# Patient Record
Sex: Female | Born: 1960 | Race: Black or African American | Hispanic: No
Health system: Southern US, Community
[De-identification: ages and names within clinical notes are randomized; demographics above are authoritative.]

---

## 2010-09-08 ENCOUNTER — Emergency Department (HOSPITAL_COMMUNITY)
Admission: EM | Admit: 2010-09-08 | Discharge: 2010-09-08 | Disposition: A | Discharge status: Home / Self Care | Payer: Self-pay | Attending: Emergency Medicine | Admitting: Emergency Medicine

## 2010-09-08 ENCOUNTER — Emergency Department (HOSPITAL_COMMUNITY): Payer: Self-pay

## 2010-09-08 DIAGNOSIS — R059 Cough, unspecified: Secondary | ICD-10-CM | POA: Insufficient documentation

## 2010-09-08 DIAGNOSIS — J90 Pleural effusion, not elsewhere classified: Secondary | ICD-10-CM | POA: Insufficient documentation

## 2010-09-08 DIAGNOSIS — R05 Cough: Secondary | ICD-10-CM | POA: Insufficient documentation

## 2010-09-08 DIAGNOSIS — J40 Bronchitis, not specified as acute or chronic: Secondary | ICD-10-CM | POA: Insufficient documentation

## 2010-09-08 LAB — RAPID STREP SCREEN (MED CTR MEBANE ONLY): Streptococcus, Group A Screen (Direct): NEGATIVE

## 2012-12-11 IMAGING — CR DG CHEST 2V
2 series · 2 of 2 positions shown · non-contrast
Comparison: None.

CLINICAL DATA: Sore throat, cough

CHEST - 2 VIEW

[w chest pa]
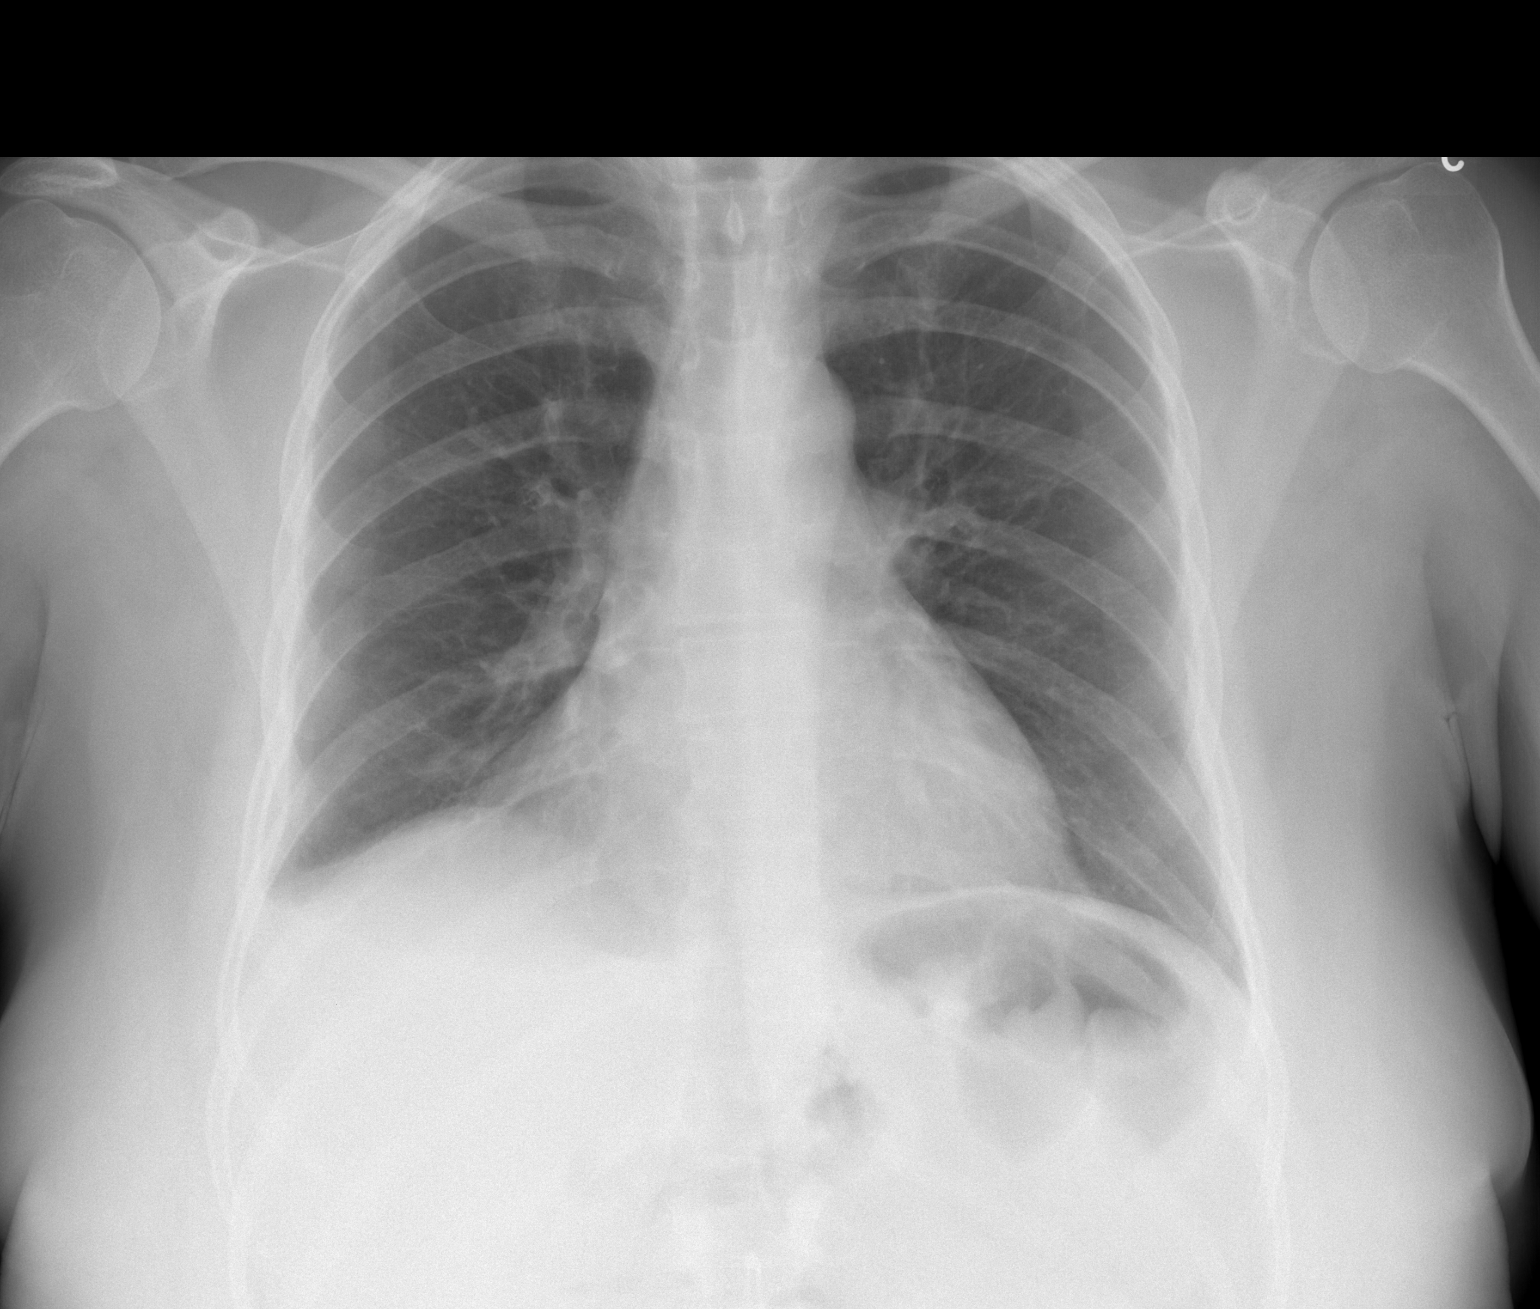

[w chest lat]
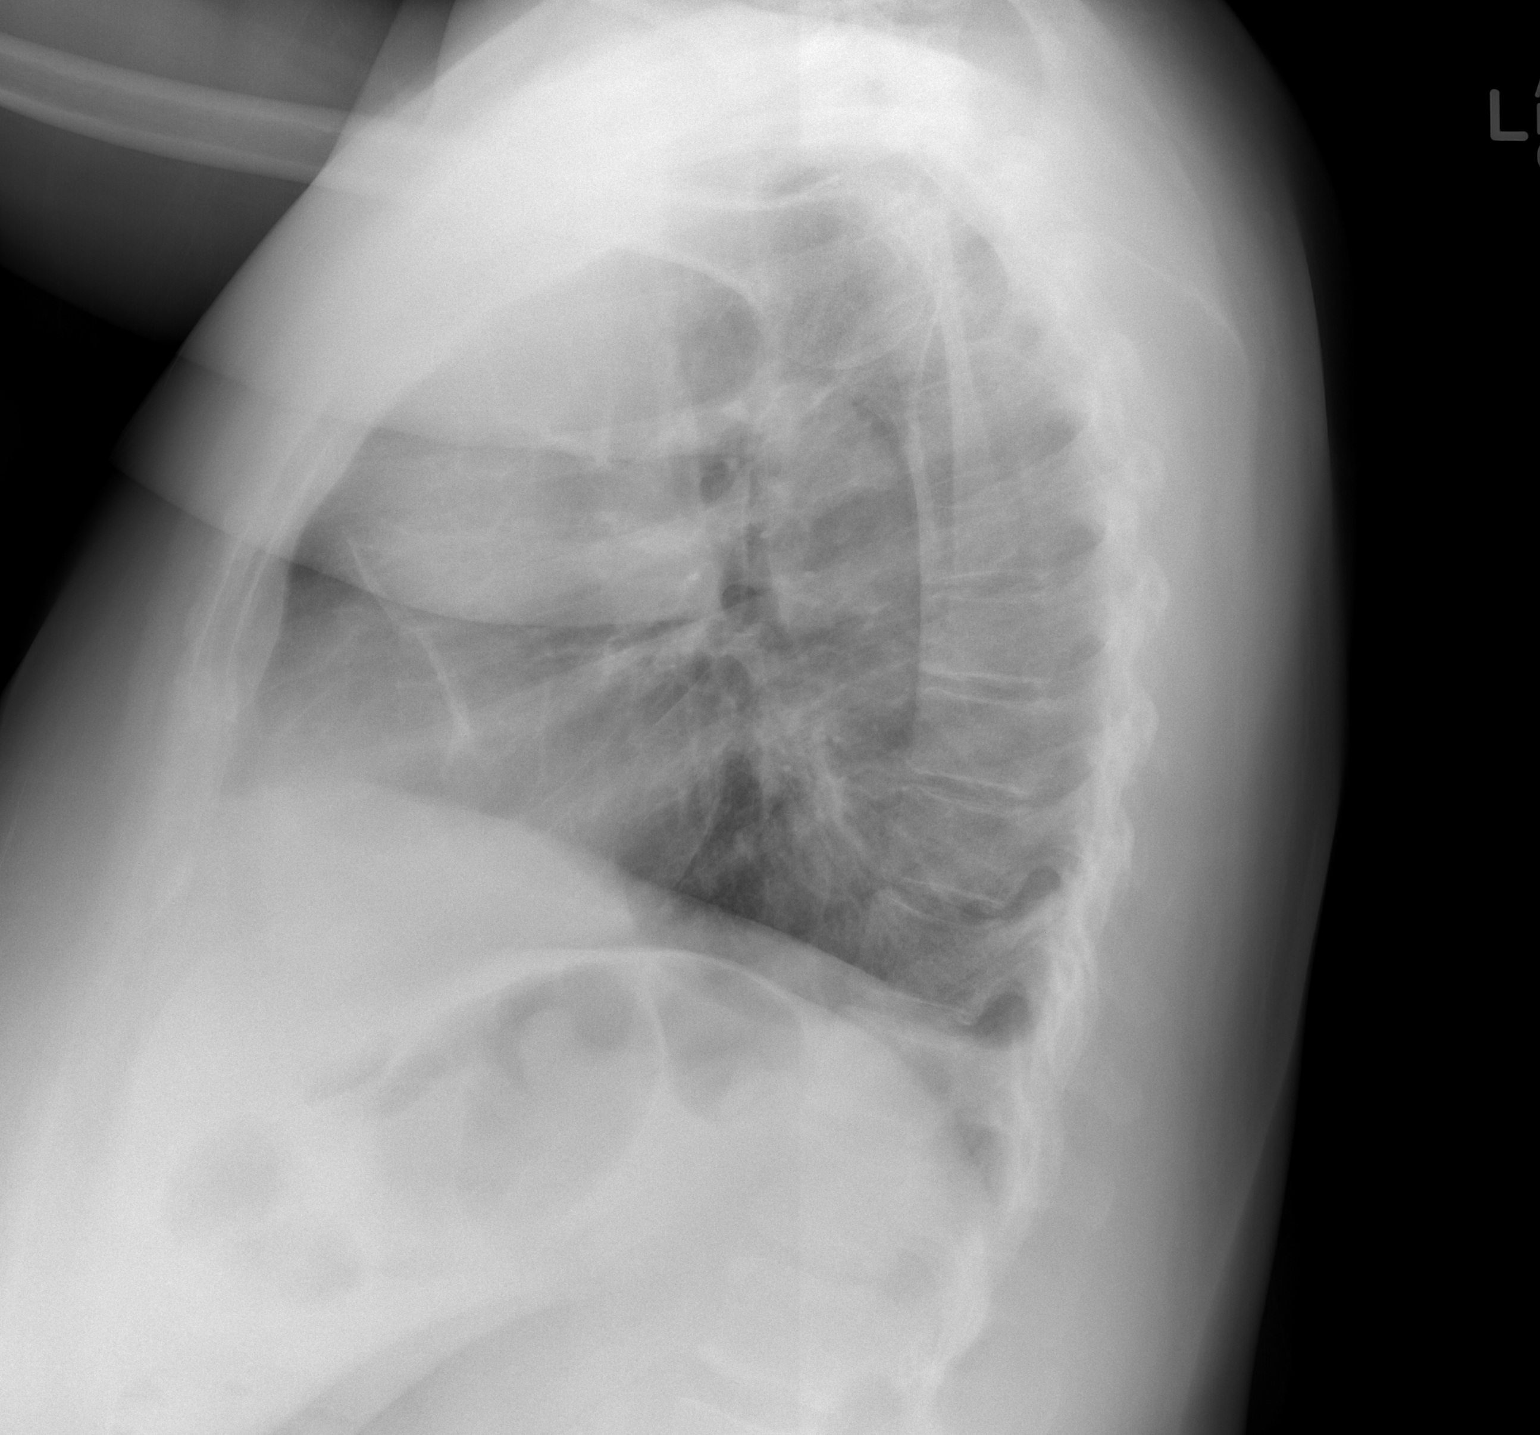

[2 of 2 positions shown; findings below may reference images not displayed]

FINDINGS: Lungs are clear.  Mild blunting of the right costophrenic
angle, likely reflecting a tiny effusion.

Cardiomediastinal silhouette is within normal limits.

Visualized osseous structures are within normal limits.
IMPRESSION: Probable tiny right pleural effusion.

## 2021-06-19 ENCOUNTER — Other Ambulatory Visit: Payer: Self-pay

## 2021-06-19 ENCOUNTER — Encounter (HOSPITAL_COMMUNITY): Payer: Self-pay | Admitting: Emergency Medicine

## 2021-06-19 ENCOUNTER — Emergency Department (HOSPITAL_COMMUNITY)
Admission: EM | Admit: 2021-06-19 | Discharge: 2021-06-19 | Disposition: A | Discharge status: Home / Self Care | Payer: Self-pay | Attending: Emergency Medicine | Admitting: Emergency Medicine

## 2021-06-19 DIAGNOSIS — Z20822 Contact with and (suspected) exposure to covid-19: Secondary | ICD-10-CM | POA: Insufficient documentation

## 2021-06-19 LAB — RESP PANEL BY RT-PCR (FLU A&B, COVID) ARPGX2
Influenza A by PCR: NEGATIVE
Influenza B by PCR: NEGATIVE
SARS Coronavirus 2 by RT PCR: NEGATIVE

## 2021-06-19 NOTE — ED Provider Notes (Signed)
?[facility]-EMERGENCY DEPT ?Provider Note ? ? ?CSN: [id number] ?Arrival date & time: 06/19/21  1151 ? ?  ? ?History ? ?No chief complaint on file. ? ? ?Madeline Pho is a 61 y.o. female with no provided medical history.  The patient presents to the ED requesting COVID-19 test.  Per patient, patient was out of town in [place] visiting her sister this past weekend on Saturday.  Patient states that her sister's husband had been having URI type symptoms for the last 3 days and had isolated himself to an upstairs bedroom of their home.  The patient reports that she entered the home to change her clothes for the events that had planned at night however she did not see her sisters husband, she did not interact with him.  The patient continues that her sister advised her that the next day, on Sunday, her husband tested positive for COVID-19.  Is here today requesting COVID-19 testing before she leaves town to visit a friend of hers.  Patient denies all symptoms.  Patient denies nausea, vomiting, diarrhea, loss of taste or smell, shortness of breath, fevers, chills, sore throat, cough, sneezing, congestion. ? ?HPI ? ?  ? ?Home Medications ?Prior to Admission medications   ?Not on File  ?   ? ?Allergies    ?Amoxicillin and Penicillins   ? ?Review of Systems   ?Review of Systems  ?Constitutional:  Negative for chills and fever.  ?HENT:  Negative for congestion, sneezing and sore throat.   ?Respiratory:  Negative for shortness of breath.   ?Gastrointestinal:  Negative for abdominal pain, diarrhea, nausea and vomiting.  ? ?Physical Exam ?Updated Vital Signs ?BP (!) 165/93 (BP Location: Right Arm)   Pulse 80   Temp 98.1 ?F (36.7 ?C) (Oral)   Resp 18   SpO2 93%  ?Physical Exam ?Vitals and nursing note reviewed.  ?Constitutional:   ?   General: She is not in acute distress. ?   Appearance: She is not ill-appearing, toxic-appearing or diaphoretic.  ?HENT:  ?   Head: Normocephalic and atraumatic.  ?   Nose:  Nose normal.  ?   Mouth/Throat:  ?   Mouth: Mucous membranes are moist.  ?Eyes:  ?   General:     ?   Right eye: No discharge.     ?   Left eye: No discharge.  ?   Extraocular Movements: Extraocular movements intact.  ?   Conjunctiva/sclera: Conjunctivae normal.  ?Cardiovascular:  ?   Rate and Rhythm: Normal rate and regular rhythm.  ?Pulmonary:  ?   Effort: Pulmonary effort is normal.  ?   Breath sounds: Normal breath sounds. No stridor. No wheezing, rhonchi or rales.  ?Abdominal:  ?   General: Abdomen is flat. Bowel sounds are normal. There is no distension.  ?   Palpations: Abdomen is soft. There is no mass.  ?   Tenderness: There is no abdominal tenderness.  ?   Hernia: No hernia is present.  ?Musculoskeletal:  ?   Cervical back: Normal range of motion and neck supple. No rigidity or tenderness.  ?Skin: ?   General: Skin is warm and dry.  ?   Capillary Refill: Capillary refill takes less than 2 seconds.  ?Neurological:  ?   Mental Status: She is alert and oriented to person, place, and time.  ? ? ?ED Results / Procedures / Treatments   ?Labs ?(all labs ordered are listed, but only abnormal results are displayed) ?Labs Reviewed  ?RESP  PANEL BY RT-PCR (FLU A&B, COVID) [id number]  ? ? ?EKG ?None ? ?Radiology ?No results found. ? ?Procedures ?Procedures  ? ? ?Medications Ordered in ED ?Medications - No data to display ? ?ED Course/ Medical Decision Making/ A&P ?  ?                        ?Medical Decision Making ? ?61 year old female presents ED requesting COVID-19 test.  Please see HPI for further details. ?On examination, patient is afebrile, nontachycardic, not hypoxic, clear lung sounds bilaterally, soft compressible abdomen.  Patient is handling her secretions on her own.  The patient does not tripoding, there is no respiratory distress noted.  The patient denies all symptoms. ? ?Patient COVID-19 test has collected and is processing right now.  I have discussed with the patient the option to follow-up on a COVID-19  test through the MyChart app.  I have advised the patient that the results of the test might not be ready/available for 1 to 2 hours.  The patient is agreeable to being discharged and following up on her COVID-19 test results at home.  In the event that she is positive, I have provided the patient with instructions and advice on how to care for herself.  I have provided her with return precautions.  I discussed the use of antiviral medication Paxlovid with the patient and at this time she is not requesting to be prescribed medication.  The patient does not have any comorbidities or underlying health conditions that would make her a strong candidate for antiviral medication. ? ?Patient stable at this time for discharge. ? ? ?Final Clinical Impression(s) / ED Diagnoses ?Final diagnoses:  ?Close exposure to COVID-19 virus  ? ? ?Rx / DC Orders ?ED Discharge Orders   ? ? None  ? ?  ? ? ?  ?Al Decant, PA-C ?06/19/21 1220 ? ?  ?Terald Sleeper, MD ?06/19/21 1835 ? ?

## 2021-06-19 NOTE — ED Triage Notes (Signed)
Patient was exposed to nephew who was dx w/ Covid recently. Reports no symptoms currently, but would like to be tested for Covid.  ?

## 2021-06-19 NOTE — Discharge Instructions (Signed)
Please follow-up on the results of your COVID-19 test through MyChart ?In the event you are positive, please follow directions and advised that we discussed today. ?
